# Patient Record
Sex: Female | Born: 1955 | Race: Black or African American | Hispanic: No | Smoking: Never smoker
Health system: Southern US, Community
[De-identification: ages and names within clinical notes are randomized; demographics above are authoritative.]

## PROBLEM LIST (undated history)

## (undated) DIAGNOSIS — D219 Benign neoplasm of connective and other soft tissue, unspecified: Secondary | ICD-10-CM

## (undated) DIAGNOSIS — R87629 Unspecified abnormal cytological findings in specimens from vagina: Secondary | ICD-10-CM

## (undated) HISTORY — PX: ABDOMINAL HYSTERECTOMY: SHX81

## (undated) HISTORY — DX: Unspecified abnormal cytological findings in specimens from vagina: R87.629

## (undated) HISTORY — PX: NOSE SURGERY: SHX723

## (undated) HISTORY — PX: NECK SURGERY: SHX720

## (undated) HISTORY — DX: Benign neoplasm of connective and other soft tissue, unspecified: D21.9

---

## 2001-02-23 ENCOUNTER — Other Ambulatory Visit: Admission: RE | Admit: 2001-02-23 | Discharge: 2001-02-23 | Payer: Self-pay | Admitting: Obstetrics and Gynecology

## 2001-07-30 ENCOUNTER — Ambulatory Visit (HOSPITAL_COMMUNITY): Admission: RE | Admit: 2001-07-30 | Discharge: 2001-07-30 | Payer: Self-pay | Admitting: Internal Medicine

## 2001-07-30 ENCOUNTER — Encounter: Payer: Self-pay | Admitting: Internal Medicine

## 2001-11-15 ENCOUNTER — Ambulatory Visit (HOSPITAL_COMMUNITY): Admission: RE | Admit: 2001-11-15 | Discharge: 2001-11-15 | Payer: Self-pay | Admitting: Internal Medicine

## 2001-11-15 ENCOUNTER — Encounter: Payer: Self-pay | Admitting: Internal Medicine

## 2002-01-10 ENCOUNTER — Ambulatory Visit (HOSPITAL_COMMUNITY): Admission: RE | Admit: 2002-01-10 | Discharge: 2002-01-11 | Payer: Self-pay | Admitting: Neurosurgery

## 2002-01-10 ENCOUNTER — Encounter: Payer: Self-pay | Admitting: Neurosurgery

## 2002-02-16 ENCOUNTER — Encounter: Admission: RE | Admit: 2002-02-16 | Discharge: 2002-02-16 | Payer: Self-pay | Admitting: Neurosurgery

## 2002-02-16 ENCOUNTER — Encounter: Payer: Self-pay | Admitting: Neurosurgery

## 2002-03-04 ENCOUNTER — Encounter: Admission: RE | Admit: 2002-03-04 | Discharge: 2002-03-04 | Payer: Self-pay | Admitting: Neurosurgery

## 2002-03-04 ENCOUNTER — Encounter: Payer: Self-pay | Admitting: Neurosurgery

## 2002-09-21 ENCOUNTER — Ambulatory Visit (HOSPITAL_COMMUNITY): Admission: RE | Admit: 2002-09-21 | Discharge: 2002-09-21 | Payer: Self-pay | Admitting: Obstetrics and Gynecology

## 2002-09-21 ENCOUNTER — Encounter: Payer: Self-pay | Admitting: Obstetrics and Gynecology

## 2003-03-20 ENCOUNTER — Encounter: Payer: Self-pay | Admitting: Obstetrics and Gynecology

## 2003-03-20 ENCOUNTER — Ambulatory Visit (HOSPITAL_COMMUNITY): Admission: RE | Admit: 2003-03-20 | Discharge: 2003-03-20 | Payer: Self-pay | Admitting: Obstetrics and Gynecology

## 2003-03-22 ENCOUNTER — Ambulatory Visit (HOSPITAL_COMMUNITY): Admission: RE | Admit: 2003-03-22 | Discharge: 2003-03-22 | Payer: Self-pay | Admitting: Obstetrics and Gynecology

## 2003-03-22 ENCOUNTER — Encounter: Payer: Self-pay | Admitting: Obstetrics and Gynecology

## 2003-05-26 ENCOUNTER — Ambulatory Visit (HOSPITAL_COMMUNITY): Admission: RE | Admit: 2003-05-26 | Discharge: 2003-05-26 | Payer: Self-pay | Admitting: Obstetrics & Gynecology

## 2003-05-26 ENCOUNTER — Encounter: Payer: Self-pay | Admitting: Obstetrics & Gynecology

## 2003-10-16 ENCOUNTER — Ambulatory Visit (HOSPITAL_COMMUNITY): Admission: RE | Admit: 2003-10-16 | Discharge: 2003-10-16 | Payer: Self-pay | Admitting: Internal Medicine

## 2004-06-04 ENCOUNTER — Ambulatory Visit (HOSPITAL_COMMUNITY): Admission: RE | Admit: 2004-06-04 | Discharge: 2004-06-04 | Payer: Self-pay | Admitting: Obstetrics & Gynecology

## 2004-06-28 ENCOUNTER — Ambulatory Visit (HOSPITAL_COMMUNITY): Admission: RE | Admit: 2004-06-28 | Discharge: 2004-06-28 | Payer: Self-pay | Admitting: Internal Medicine

## 2005-06-11 ENCOUNTER — Ambulatory Visit (HOSPITAL_COMMUNITY): Admission: RE | Admit: 2005-06-11 | Discharge: 2005-06-11 | Payer: Self-pay | Admitting: Internal Medicine

## 2005-11-08 ENCOUNTER — Emergency Department (HOSPITAL_COMMUNITY): Admission: EM | Admit: 2005-11-08 | Discharge: 2005-11-08 | Payer: Self-pay | Admitting: Emergency Medicine

## 2005-11-17 ENCOUNTER — Inpatient Hospital Stay (HOSPITAL_COMMUNITY): Admission: AD | Admit: 2005-11-17 | Discharge: 2005-11-21 | Payer: Self-pay | Admitting: Obstetrics and Gynecology

## 2005-11-19 ENCOUNTER — Encounter: Payer: Self-pay | Admitting: Obstetrics and Gynecology

## 2006-06-19 IMAGING — CT CT PELVIS W/ CM
1 of 3 series · 14 of 32 positions shown, 19 images · IV contrast (CONTRAST)
Comparison: None.

CLINICAL DATA: Right low back pain and pelvic pain.  
ABDOMEN CT WITH CONTRAST:
TECHNIQUE: Multidetector CT imaging of the abdomen was performed following the standard protocol during bolus administration of intravenous contrast.
Contrast:  cc Omnipaque 300.
TECHNIQUE: Multidetector CT imaging of the pelvis was performed following the standard protocol during bolus administration of intravenous contrast.

[Series 31: — · axial · 0.74mm/px · z∈[+1149,+1584]mm · 14 of 99 slices shown, 19 images]
[im 6/99  soft-tissue]
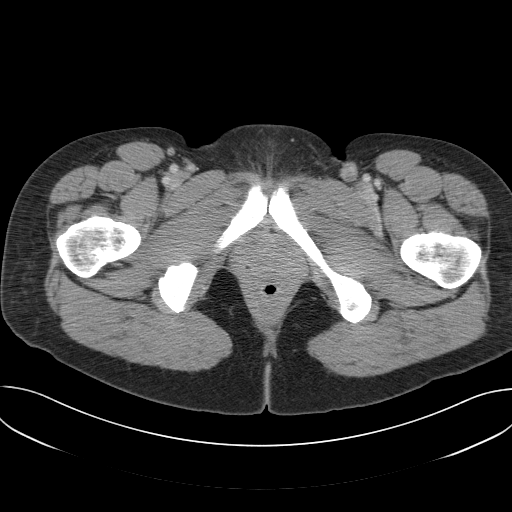
[im 6/99  bone]
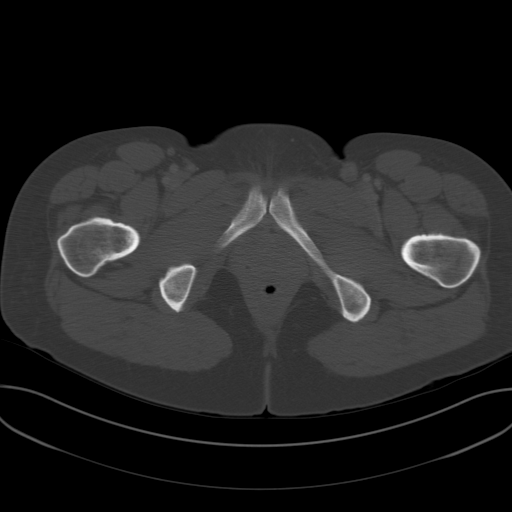
[im 11/99  soft-tissue]
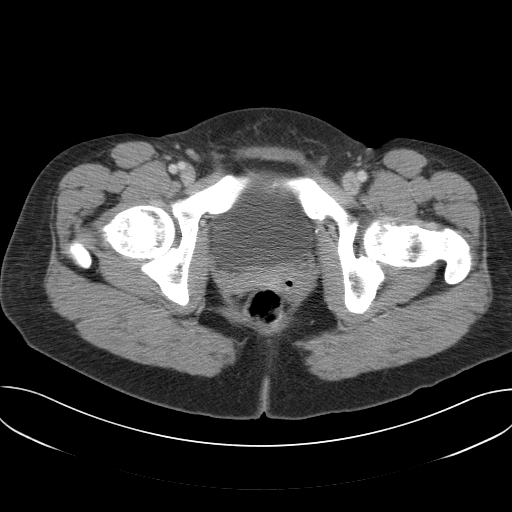
[im 22/99  soft-tissue]
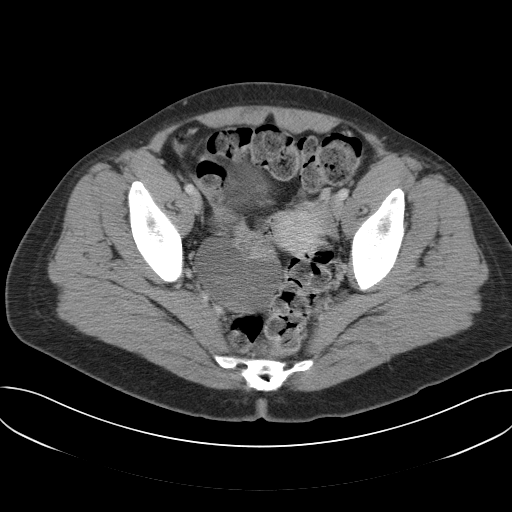
[im 28/99  soft-tissue]
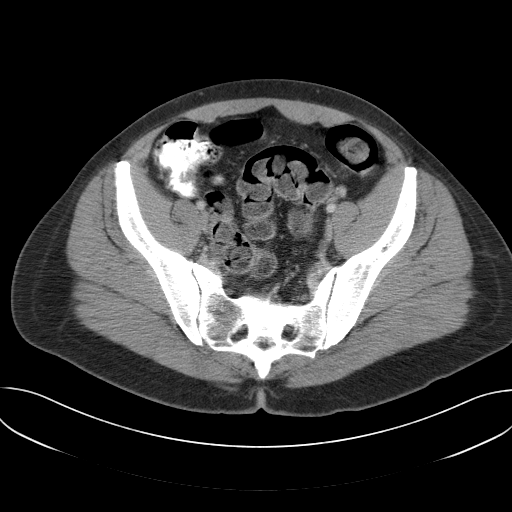
[im 33/99  soft-tissue]
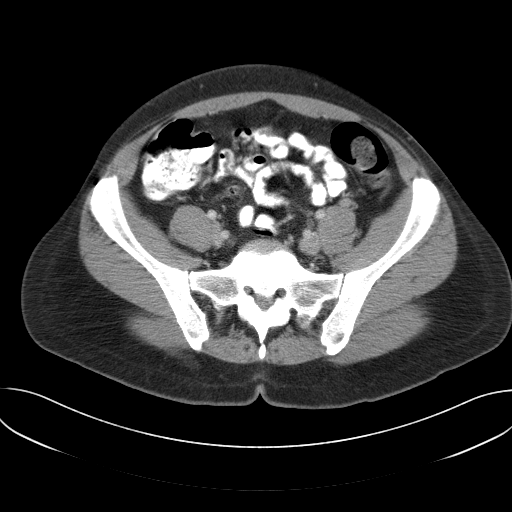
[im 44/99  soft-tissue]
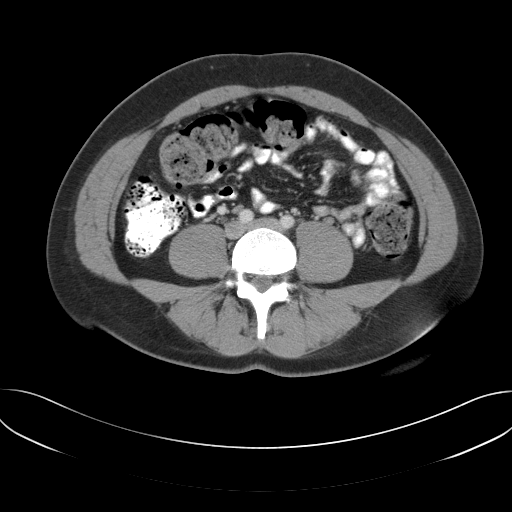
[im 50/99  soft-tissue]
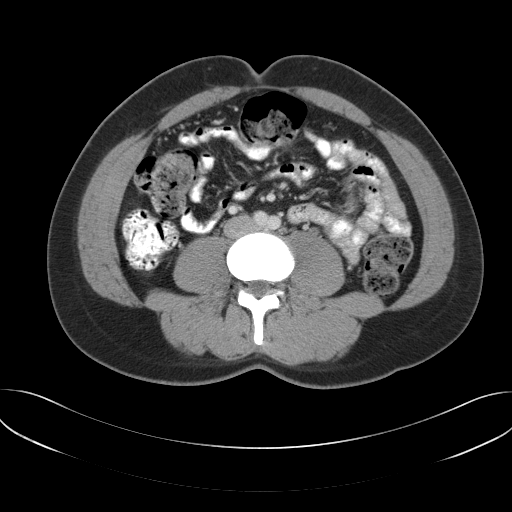
[im 55/99  soft-tissue]
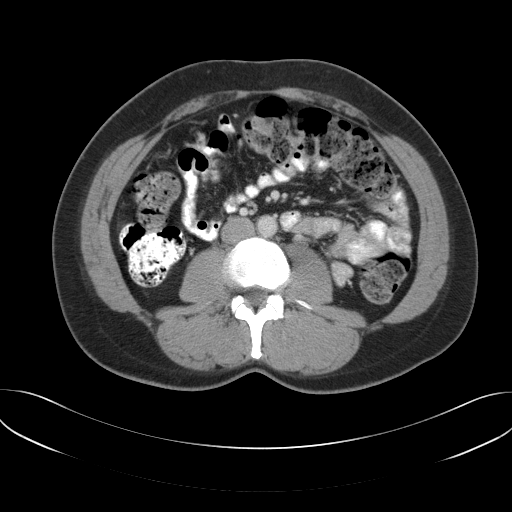
[im 66/99  soft-tissue]
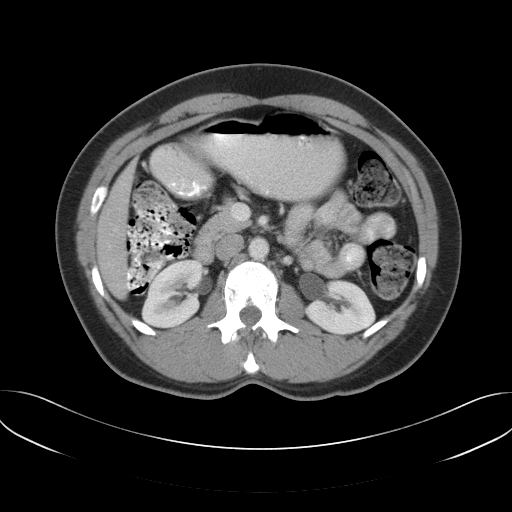
[im 66/99  bone]
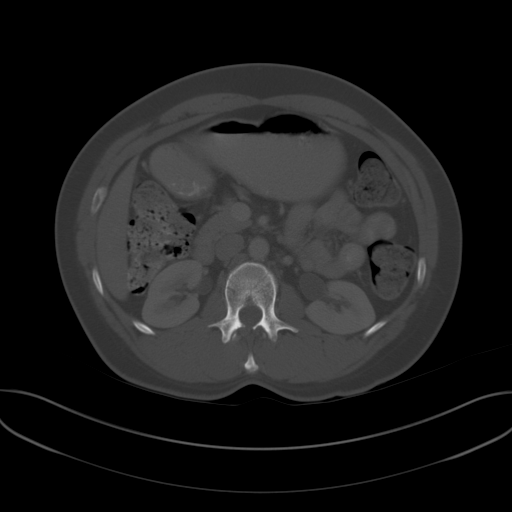
[im 71/99  soft-tissue]
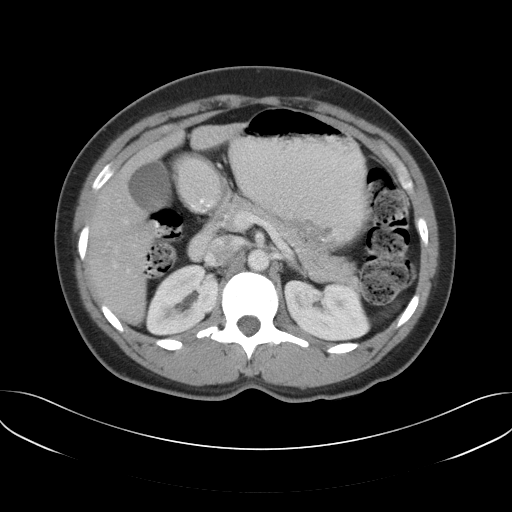
[im 77/99  soft-tissue]
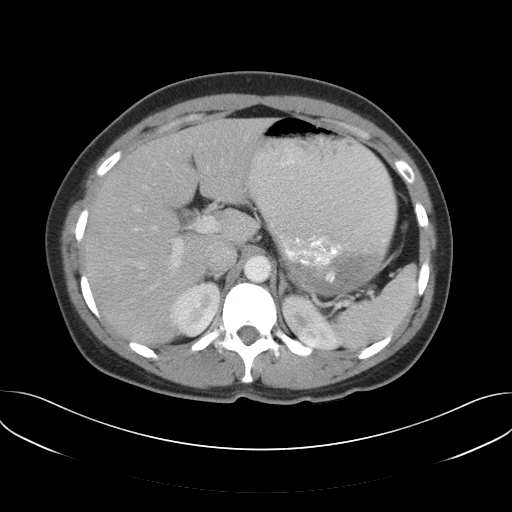
[im 77/99  lung]
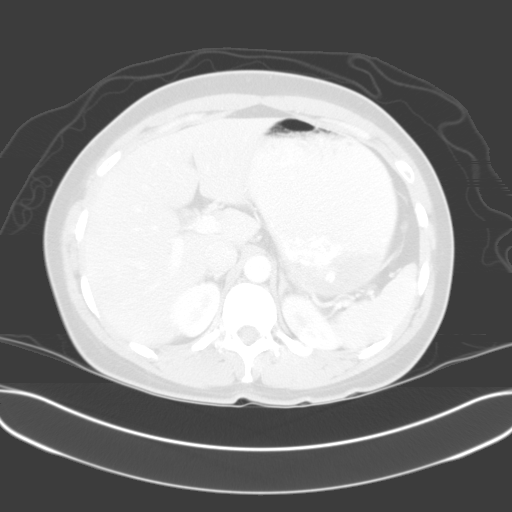
[im 82/99  lung]
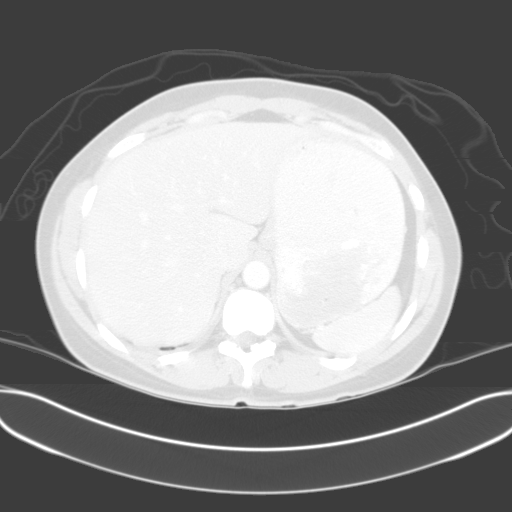
[im 88/99  soft-tissue]
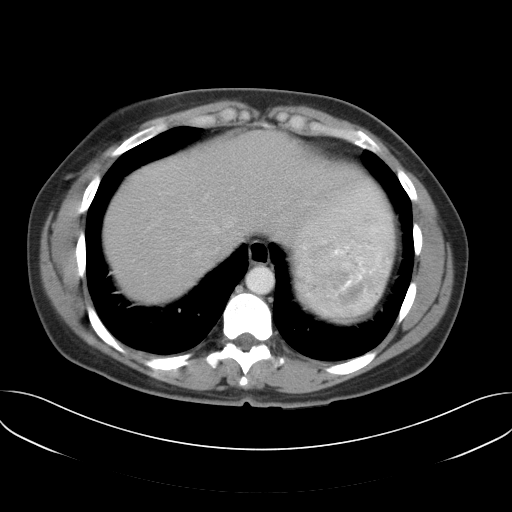
[im 88/99  lung]
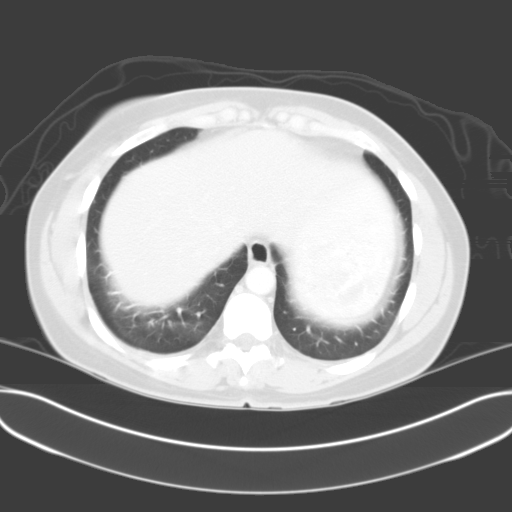
[im 93/99  soft-tissue]
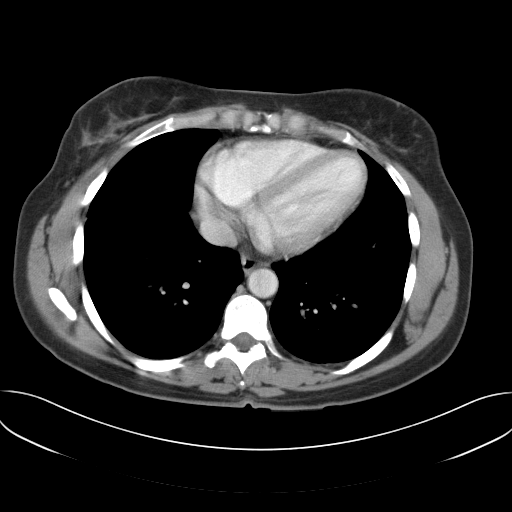
[im 93/99  lung]
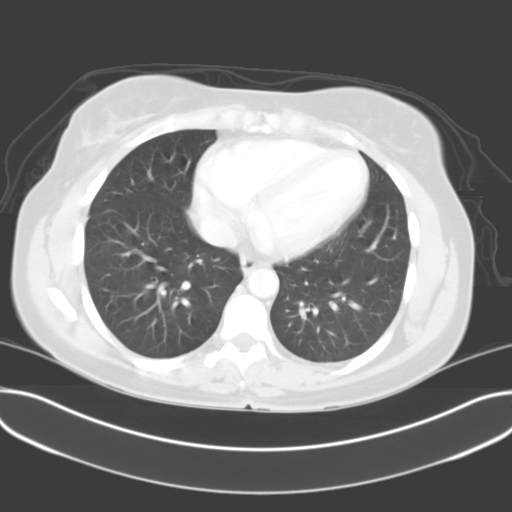

[14 of 32 positions shown; findings below may reference images not displayed]

FINDINGS: Visualized lung bases are clear.  No pleural or pericardial effusion.  
The liver is normal in attenuation and morphology.  Spleen is negative.  Adrenal glands are negative.  The pancreas is negative.  
Right kidney negative.  
Left kidney, prominent extrarenal pelvis but no evidence for nephrolithiasis or hydronephrosis.
The appendix is normal measuring 5.6 mm in diameter.  
The visualized abdominal bowel loops are normal.  
Review of the bone windows is unremarkable.
IMPRESSION: No acute abdomen CT findings.  
PELVIS CT WITH CONTRAST:
FINDINGS: Urinary bladder is normal.  The uterus is normal in appearance.
Left ovary is negative.  Within the right adnexa, there is a predominantly fluid attenuating mass containing some internal septations, measuring approximately 5.5 x 6.7 cm (image #78).  I suspect this is ovarian in origin.  This may represent either a benign ovarian cyst.  Ovarian cystic neoplasm is not excluded.  Follow-up imaging is recommended to ensure complete resolution.  There is no free pelvic fluid.  The visualized pelvic bowel loops are negative.  No inguinal or pelvic lymphadenopathy is noted.
IMPRESSION: Large predominantly cystic mass in the right adnexa, likely ovarian in origin.  Although this may represent a large ovarian cyst, cannot rule out neoplasm.  Follow-up examination is recommended to ensure complete resolution.

## 2006-06-29 ENCOUNTER — Ambulatory Visit (HOSPITAL_COMMUNITY): Admission: RE | Admit: 2006-06-29 | Discharge: 2006-06-29 | Payer: Self-pay | Admitting: Obstetrics and Gynecology

## 2007-07-19 ENCOUNTER — Ambulatory Visit (HOSPITAL_COMMUNITY): Admission: RE | Admit: 2007-07-19 | Discharge: 2007-07-19 | Payer: Self-pay | Admitting: Obstetrics and Gynecology

## 2007-09-22 ENCOUNTER — Other Ambulatory Visit: Admission: RE | Admit: 2007-09-22 | Discharge: 2007-09-22 | Payer: Self-pay | Admitting: Obstetrics and Gynecology

## 2008-10-03 ENCOUNTER — Ambulatory Visit (HOSPITAL_COMMUNITY): Admission: RE | Admit: 2008-10-03 | Discharge: 2008-10-03 | Payer: Self-pay | Admitting: Obstetrics and Gynecology

## 2008-10-03 ENCOUNTER — Other Ambulatory Visit: Admission: RE | Admit: 2008-10-03 | Discharge: 2008-10-03 | Payer: Self-pay | Admitting: Obstetrics and Gynecology

## 2010-02-25 ENCOUNTER — Ambulatory Visit: Payer: Self-pay | Admitting: Gastroenterology

## 2010-02-25 DIAGNOSIS — R1319 Other dysphagia: Secondary | ICD-10-CM

## 2010-02-25 DIAGNOSIS — R1012 Left upper quadrant pain: Secondary | ICD-10-CM | POA: Insufficient documentation

## 2010-02-25 DIAGNOSIS — R1013 Epigastric pain: Secondary | ICD-10-CM

## 2010-02-25 DIAGNOSIS — K5909 Other constipation: Secondary | ICD-10-CM | POA: Insufficient documentation

## 2010-02-26 ENCOUNTER — Telehealth (INDEPENDENT_AMBULATORY_CARE_PROVIDER_SITE_OTHER): Payer: Self-pay

## 2010-03-07 ENCOUNTER — Ambulatory Visit: Payer: Self-pay | Admitting: Gastroenterology

## 2010-03-07 ENCOUNTER — Ambulatory Visit (HOSPITAL_COMMUNITY): Admission: RE | Admit: 2010-03-07 | Discharge: 2010-03-07 | Payer: Self-pay | Admitting: Gastroenterology

## 2010-03-21 ENCOUNTER — Other Ambulatory Visit: Admission: RE | Admit: 2010-03-21 | Discharge: 2010-03-21 | Payer: Self-pay | Admitting: Obstetrics and Gynecology

## 2010-07-17 ENCOUNTER — Ambulatory Visit: Payer: Self-pay | Admitting: Gastroenterology

## 2010-12-10 NOTE — Letter (Signed)
Summary: EGD/TCS ORDER  EGD/TCS ORDER   Imported By: Ave Filter 02/25/2010 10:52:01  _____________________________________________________________________  External Attachment:    Type:   Image     Comment:   External Document  Appended Document: EGD/TCS ORDER Added take half Glucophage to pt's instructions and changed date of Dulcolax to 4/25 and 4/26.

## 2010-12-10 NOTE — Progress Notes (Signed)
Summary: phone note/pt called and said she takes asa daily  Phone Note Call from Patient   Caller: Patient Summary of Call: Pt called and said she forgot to let us know she takes an 81 mg ASA everyday.  She is scheduled for a procedure on 03/07/2010. Please advise! Initial call taken by: Cloria Spring LPN,  February 26, 2010 4:33 PM     Appended Document: phone note/pt called and said she takes asa daily May stay on ASA.  Appended Document: phone note/pt called and said she takes asa daily LMOM to call.  Appended Document: phone note/pt called and said she takes asa daily Returned pt's VM. LMOM OK to stay on ASA, and for her to return call and let me know she got the message.  Appended Document: phone note/pt called and said she takes asa daily Pt returned call and spoke to Darl Pikes and told her she got my message to stay on the ASA.

## 2010-12-10 NOTE — Assessment & Plan Note (Signed)
Summary: ABD PAIN, DYSPAHAGIA, CONSTIPATION   Visit Type:  Follow-up Visit Primary Care Provider:  Emelda Fear, M.D.  Chief Complaint:  3 month follow up- doing ok.  History of Present Illness: Having a BM more often with MLX. OMP helped abd pain. No nausea or vomiting. No questions about the procedure. No problems swallowing. Drinks water once daily and eats Cheerios, whole wheat bread. Not using ASA or Ibuprofen.  Current Medications (verified): 1)  Singulair 10 Mg Tabs (Montelukast Sodium) .... Once Daily 2)  Glucophage Xr 500 Mg Xr24h-Tab (Metformin Hcl) .... Once Daily 3)  Zyrtec Allergy 10 Mg Tabs (Cetirizine Hcl) .... Once Daily 4)  Nasonex 50 Mcg/act Susp (Mometasone Furoate) .... Once Daily 5)  Proventil Hfa 108 (90 Base) Mcg/act Aers (Albuterol Sulfate) .... Once Daily 6)  Advair Diskus 250-50 Mcg/dose Aepb (Fluticasone-Salmeterol) .... Once Daily 7)  Polyethylene Glycol 3350  Powd (Polyethylene Glycol 3350) .Marland KitchenMarland KitchenMarland Kitchen 17 Grams By Mouth Daily For Constipation 8)  Advil 200 Mg Tabs (Ibuprofen) .... Prn 9)  Asa 81 Mg .... Take 1 Tablet By Mouth Once A Day 10)  Omeprazole 20 Mg Cpdr (Omeprazole) .... Once Daily  Allergies (verified): 1)  ! Pcn  Past History:  Past Surgical History: Last updated: 02/25/2010 Right oophorectomy with hysterectomy. Right hemorrhagic cyst and uterine fibroids. Cervical disc surgery Deviated septum  Social History: Last updated: 07/17/2010 Single. Grown daughter. Equity: [employer]. Never smoked. No drug. No alcohol.   Past Medical History: Diabetes, dx 2004 Allergies Asthma APR 2011: TCS/EGD/DIL 16-NSAID GASTRITIS  Social History: Single. Grown daughter. Equity: [employer]. Never smoked. No drug. No alcohol.   Vital Signs:  Patient profile:   55 year old female Height:      67.5 inches Weight:      170 pounds BMI:     26.33 Temp:     98.2 degrees F oral Pulse rate:   72 / minute BP sitting:    128 / 88  (left arm) Cuff size:   regular  Vitals Entered By: Hendricks Limes LPN (July 17, 2010 8:54 AM)  Physical Exam  General:  Well developed, well nourished, no acute distress. Head:  Normocephalic and atraumatic. Lungs:  Clear throughout to auscultation. Heart:  Regular rate and rhythm; no murmurs. Abdomen:  Soft, nontender and nondistended.  Normal bowel sounds.  Impression & Recommendations:  Problem # 1:  LUQ PAIN (ICD-789.02) Assessment Improved Continue PPI. May take ASA 81 mg daily. If pain returns only take if medically necessary. Avoid ibuprofenor naproxen if they cause abd pain.  Problem # 2:  OTHER DYSPHAGIA (ICD-787.29) Assessment: Improved No indication for EGD. OPV as needed.  CC:PCP  Problem # 3:  CONSTIPATION, CHRONIC (ICD-564.09) Assessment: Improved Continue water, fiber, and MLX. TCS 2021.  Appended Document: Orders Update    Clinical Lists Changes  Orders: Added new Service order of Est. Patient Level I (16109) - Signed

## 2010-12-10 NOTE — Assessment & Plan Note (Signed)
Summary: ABD. PAIN/CONSULT FOR TCS/SS   Visit Type:  Initial Consult Primary Care Provider:  Dr. Felecia Shelling  Chief Complaint:  abd pain and needs tcs.  History of Present Illness: [name] is a pleasant 55 year old lady who presents today for abdominal pain and chronic constipation. Last year she was referred for colonoscopy but she postponed it until now. She called earlier this month to schedule however because of symptoms she was asked to be seen in the office. She complains of chronic constipation all her life. She may have one bowel movement per week. She does take anything for her bowels. She denies any blood in the stool or melena. Denies any weight loss. About 10 days ago she began having epigastric pain which she described as crampy in nature. She also had some loose stools for a couple of days.  The pain continues off/on since that time. She also has chronic pain in the left upper abdomen. She's not sure if the pain is aggravated by meals. She takes Advil for pain intermittently. She also complains of intermittent dysphagia to solid foods. She had prior cervical neck surgery. She feels like her throat is closing at times.    Current Medications (verified): 1)  Singulair 10 Mg Tabs (Montelukast Sodium) .... Once Daily 2)  Glucophage Xr 500 Mg Xr24h-Tab (Metformin Hcl) .... Once Daily 3)  Zyrtec Allergy 10 Mg Tabs (Cetirizine Hcl) .... Once Daily 4)  Nasonex 50 Mcg/act Susp (Mometasone Furoate) .... Once Daily 5)  Proventil Hfa 108 (90 Base) Mcg/act Aers (Albuterol Sulfate) .... Once Daily 6)  Advair Diskus 250-50 Mcg/dose Aepb (Fluticasone-Salmeterol) .... Once Daily  Allergies (verified): 1)  ! Pcn  Past History:  Past Medical History: Diabetes, dx 2004 Allergies Asthma  Past Surgical History: Right oophorectomy with hysterectomy. Right hemorrhagic cyst and uterine fibroids. Cervical disc surgery Deviated septum  Family History: No FH of CRC, liver disease,  chronic GI illness Mother, DM 4 brothers, 2 with heart murmur  Social History: Single. Grown daughter. Equity. Never smoked. No drug. No alcohol.   Review of Systems General:  Denies fever, chills, sweats, anorexia, fatigue, weakness, and weight loss. Eyes:  Denies vision loss. ENT:  Complains of difficulty swallowing; denies nasal congestion, sore throat, and hoarseness. CV:  Denies chest pains, angina, palpitations, dyspnea on exertion, and peripheral edema. Resp:  Denies dyspnea at rest, dyspnea with exercise, and cough. GI:  See HPI. GU:  Denies urinary burning and blood in urine. MS:  Denies joint pain / LOM. Derm:  Denies rash and itching. Neuro:  Denies weakness, frequent headaches, memory loss, and confusion. Psych:  Denies depression and anxiety. Endo:  Denies unusual weight change. Heme:  Denies bruising and bleeding. Allergy:  Denies hives and rash.  Vital Signs:  Patient profile:   55 year old female Height:      67.5 inches Weight:      165 pounds BMI:     25.55 Temp:     98.0 degrees F oral Pulse rate:   84 / minute BP sitting:   138 / 98  (left arm) Cuff size:   regular  Vitals Entered By: Hendricks Limes LPN (February 25, 2010 9:48 AM)  Physical Exam  General:  Well developed, well nourished, no acute distress. Head:  Normocephalic and atraumatic. Eyes:  Conjunctivae pink, no scleral icterus.  Mouth:  Oropharyngeal mucosa moist, pink.  No lesions, erythema or exudate.    Neck:  Supple; no masses or thyromegaly. Lungs:  Clear throughout  to auscultation. Heart:  Regular rate and rhythm; no murmurs, rubs,  or bruits. Abdomen:  Soft. Positive BS. Mild epigastric tenderness to deep palpation. No HSM or masses. No abd bruit or hernia. No rebound or guarding. Rectal:  deferred until time of colonoscopy.   Extremities:  No clubbing, cyanosis, edema or deformities noted. Neurologic:  Alert and  oriented x4;  grossly normal neurologically. Skin:  Intact without  significant lesions or rashes. Cervical Nodes:  No significant cervical adenopathy. Psych:  Alert and cooperative. Normal mood and affect.  Impression & Recommendations:  Problem # 1:  CONSTIPATION, CHRONIC (ICD-564.09)  Add miralax 17 grams by mouth daily. Drink plenty of fluids and increase dietary fiber. Daily excercise. Colonoscopy to be performed in near future.  Risks, alternatives, and benefits including but not limited to the risk of reaction to medication, bleeding, infection, and perforation were addressed.  Patient voiced understanding and provided verbal consent. Modified prep secondary to constipation.  Orders: New Patient Level III (16109)  Problem # 2:  EPIGASTRIC PAIN (ICD-789.06)  Chronic left upper abd tenderness but recent epigastric tenderness. Etiology unclear. She's not sure if aggravated by meals. Could be related to constipation, gastritis, pud, biliary. EGD to be performed in near future.  Risks, alternatives, benefits including but not limited to risk of reaction to medications, bleeding, infection, and perforation addressed.  Patient voiced understanding and verbal consent obtained.   Orders: New Patient Level III (60454)  Problem # 3:  OTHER DYSPHAGIA (ICD-787.29)  ?secondary to prior neck surgery. Symptoms intermittent. Cannot rule out esophageal abnormality. EGD to be performed in near future.  Risks, alternatives, benefits including but not limited to risk of reaction to medications, bleeding, infection, and perforation addressed.  Patient voiced understanding and verbal consent obtained.   Orders: New Patient Level III (09811)  Patient Instructions: 1)  Begin miralax 17 gram by mouth daily for constipation. 2)  The medication list was reviewed and reconciled.  All changed / newly prescribed medications were explained.  A complete medication list was provided to the patient / caregiver. Prescriptions: POLYETHYLENE GLYCOL 3350  POWD (POLYETHYLENE GLYCOL  3350) 17 grams by mouth daily for constipation  #527g x 5   Entered and Authorized by:   Leanna Battles. Dixon Boos   Signed by:   Leanna Battles Jurney Overacker PA-C on 02/25/2010   Method used:   Print then Give to Patient   RxID:   [id number]   Appended Document: ABD. PAIN/CONSULT FOR TCS/SS NEEDS PEDS SCOPE.

## 2011-01-28 LAB — GLUCOSE, CAPILLARY: Glucose-Capillary: 101 mg/dL — ABNORMAL HIGH (ref 70–99)

## 2011-03-28 NOTE — Op Note (Signed)
[place]  Patient:    Christie Morgan, Christie Morgan Visit Number: [id number] MRN: [id number]          Service Type: DSU Location: 3000 [id number] Attending Physician:  Donn Pierini Dictated by:   Julio Sicks, M.D. Proc. 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  OPERATION PERFORMED:  C4-5 and C5-6 anterior cervical diskectomy and fusion with allograft and anterior plating.  SURGEON:  Julio Sicks, M.D.  ASSISTANT:  Bradd Canary., M.D.  ANESTHESIA:  General endotracheal.  INDICATIONS FOR PROCEDURE:  The patient is a 55 year old female with a history of neck and bilateral upper extremity symptoms, left much greater than right. Her symptoms predominantly consist of a left-sided C5 radiculopathy.  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  MRI scanning demonstrated a large leftward C4-5 disk herniation with compression of the left-sided C5 nerve root.  Also has a bilobed disk herniation at C5-6 with significant spinal stenosis.  There is an asymptomatic right-sided C6-7 disk herniation which is not addressed at this time.  The patient has been counseled as to her options.  She has decided to proceed with the C4-5 and C5-6 anterior cervical diskectomy and fusion with allograft and anterior plating for hopeful improvement in her symptoms.   DESCRIPTION OF PROCEDURE:  The patient was taken to the operating room and placed on the operating table in supine position.  After an adequate level of anesthesia was achieved, the patient was positioned supine with the neck slightly extended and held in place with halter traction.  The  patients anterior cervical region was shaved and prepped sterilely.  A 10 blade was used to make a linear incision extending from just right of midline to the medial border of the sternocleidomastoid on the right.  This was carried down sharply to the platysma.  The platysma was then elevated and divided vertically and dissection proceeded along the medial border of the sternocleidomastoid muscle and carotid sheath on the right side.  The trachea and esophagus were mobilized and retracted towards the left.  Prevertebral fascia was stripped off the anterior spinal column.  The longus colli muscles were then elevated bilaterally using electrocautery.  Deep self-retaining retractor was placed.  Intraoperative fluoroscopy was used and the C4-5 and C5-6 levels were confirmed.  The disk space was then incised at both levels with a 15 blade in a rectangular fashion.  A wide disk space clean-out was then achieved using pituitary rongeurs, forward and backward angled Carlens curets, Kerrison rongeurs and a high speed drill to remove all the disk down to the level of the posterior annulus.  Microscope was brought into the field and used throughout the remainder of the diskectomy.  Starting first at C4-5 the remaining aspects of annulus and osteophytes were removed down to the level of the posterior longitudinal ligament.  The posterior longitudinal ligament was then elevated and resected in piecemeal fashion using Kerrison rongeurs.  The underlying thecal sac was identified.  Decompression was then performed in the central spinal canal by undercutting the bodies of C4 and C5. Decompression then proceeded out to the left  sided C5 foramen.  All elements of disk herniation were encountered and completely resected.  The C5 nerve root was identified proximally and followed out distally to its foramen.  A wide anterior foraminotomy was performed.  Decompression then proceeded out to the right sided C5  foramen.  The C5 nerve root was identified proximally and decompressed anteriorly.  Attention was then placed at the C5-6 level.  Once again, the remaining aspects of osteophyte and annulus were removed down to the level of the posterior longitudinal ligament.  The posterior longitudinal ligament was then elevated and resected in piecemeal fashion using Kerrison rongeurs.  The underlying thecal sac was identified.  A wide central decompression was then performed by using Kerrison rongeurs to undercut the bodies of C5 and C6.  Decompression then proceeded out to the left-sided C6 foramen.  The C6 nerve root was identified and widely decompression throughout its foramen.  All elements of the disk herniation were completely resected. Decompression then proceeded out to the right-sided C6 foramen.  Once again, a soft disk herniation was encountered and completely resected.  Decompression then proceeded out to the right-sided C6 foramen.  The C6 nerve root was identified proximally and widely decompressed anteriorly.  The wound was then copiously irrigated with antibiotic solution.  Gelfoam was placed in each wound for hemostasis which was found to be good.  A 7 mm patellar wedge allograft was impacted into place and recessed approximately 1 mm from the anterior cortical surface at both levels.  A 40 mm Atlantis anterior cervical plate was then placed over the C4, C5 and C6 levels.  It was then attached under fluoroscopic guidance by using 14 mm fixed angle screws, two each at C4, C5 and C6.  All six screws were given a final tightening and found to be solidly within bone.  Locking screws were engaged at both levels.  Retraction system was then removed.  Final images revealed good position of the bone grafts and hardware at the proper operative level with normal alignment of the spine.  Wound was inspected for hemostasis which was found to be good.  It was then closed in a routine fashion.   Steri-Strips and sterile dressing were applied.  There was no apparent complication.  The patient returned to the recovery room postoperatively. Dictated by:   Julio Sicks, M.D.  Attending Physician:  Donn Pierini DD:  01/10/02 TD:  01/10/02 Job: [id number]

## 2011-03-28 NOTE — Op Note (Signed)
NAME:  Christie Morgan, Christie Morgan              ACCOUNT NO.:  [id number]   MEDICAL RECORD NO.:  [id number]          PATIENT TYPE:  INP   LOCATION:  A427                          FACILITY:  [facility]   PHYSICIAN:  Tilda Burrow, M.D. DATE OF BIRTH:  11-Mar-1956   DATE OF PROCEDURE:  DATE OF DISCHARGE:                                 OPERATIVE REPORT   PREOPERATIVE DIAGNOSES:  1.  Right ovarian mass.  2.  Uterine fibroids.   POSTOPERATIVE DIAGNOSES:  1.  Right ovarian hemorrhagic cyst.  2.  Uterine fibroids.   PROCEDURE:  1.  Supracervical hysterectomy.  2.  Right salpingo-oophorectomy.   SURGEON:  Tilda Burrow, M.D.   ASSISTANTAmie Critchley.   ANESTHESIA:  General.   SPECIMENS:  Uterus and right adnexa sent to lab.   ESTIMATED BLOOD LOSS:  150 mL.   COMPLICATIONS:  Distinctly redundant sigmoid colon with generous hard stool  despite magnesium citrate taken preoperative.  Patient suspected to have an  abnormal bowel function.   DETAILS OF PROCEDURE:  The patient was taken to the operating room and  prepped and draped for lower abdominal surgery.  An old Pfannenstiel-type  incision was performed, opening the abdominal cavity without difficulty.  The bowel was inspected, packed away, and pelvis addressed.  The right ovary  was found to have large cyst which were felt to warrant removal.  We  proceeded with taking down the round ligaments with suture ligature of #0  chromic, transection and then ligature with #0 chromic. On the right side  the infundibulopelvic ligament was isolated; and accessed while being  careful to ensure that the ureter was well out of the surgical field, cross  clamping the infundibulopelvic ligament with Kelly clamp, transecting it and  suture ligating with #0 chromic.   On the left side the tube and ovary were considered salvageable and so the  uteroovarian ligament was identified, isolated, clamped, cut, and suture  ligated along with the left tube and  round ligament pedicle.   The uterine vessels were then skeletonized, cross-clamped with curved Heaney  clamps, followed by Tresa Endo clamp placed above for back bleeding, and then the  uterine vessels transected and suture ligated with #0 chromic. The upper and  lower cardinal ligaments were serially clamped, cut, and suture ligated  using straight Heaney clamp, knife dissection and suture ligature with #0  chromic.  Upon reaching the level of the lower cardinal ligaments, while  resting just above the uterosacral ligaments into the posterior aspects of  the cervix, we proceeded with amputation of the lower uterine segment off of  the cervix. This was performed with a slight coring effect, leaving a small  portion of the endocervical tissue intact.  The cervix, itself, was then  closed with running #0 chromic and inspected for hemostasis. The irrigation  confirmed that the hemostasis was adequate, and the bladder flap used to  reperitonealize the pelvic floor over the cervical stump.  Irrigation,  again, confirmed hemostasis.   We then reapproximated the tissues with interrupted 2-0 chromic suture.  Pedicles were inspected and found hemostatic otherwise. The  patient then  went to recovery room in good condition.   ADDENDUM:  It should be noted that the patient's redundant sigmoid had  generous amounts of hard, formed stool despite magnesium citrate, given  during the preoperative time the night before. This was specifically  confirmed with the patient the next day.  She may be a candidate for  consideration of anti-IBS medications in the future, such as Zelnorm.      Tilda Burrow, M.D.  Electronically Signed     JVF/MEDQ  D:  12/10/2005  T:  12/11/2005  Job:  [id number]

## 2011-03-28 NOTE — Discharge Summary (Signed)
NAME:  Christie Morgan, Christie Morgan             ACCOUNT NO.:  [id number]   MEDICAL RECORD NO.:  [id number]          PATIENT TYPE:  INP   LOCATION:  A427                          FACILITY:  [facility]   PHYSICIAN:  Tilda Burrow, M.D. DATE OF BIRTH:  06/10/1956   DATE OF ADMISSION:  11/17/2005  DATE OF DISCHARGE:  01/12/2007LH                                 DISCHARGE SUMMARY   ADMISSION DIAGNOSES:  Severe pelvic and right lower flank pain.   DISCHARGE DIAGNOSES:  1.  Severe pelvic and right lower flank pain, improved.  2.  Chronic constipation.   PROCEDURES:  Supracervical hysterectomy performed on November 19, 2005.   HISTORY OF PRESENT ILLNESS:  This 55 year old female was admitted for pain  management evaluation of pelvic discomfort.  After being seen in our office  after evaluation as an outpatient in follow up in the emergency room earlier  this weekend when she was tearfully uncomfortable when seen in the emergency  room.  The pain went around the lower abdomen on the right lower quadrant  without nausea or vomiting.  On the exam, she showed soft fullness behind  the uterus on the right side making the pain dramatically worse with a soft  fluctuance, raising questions of evaluation.  Review of some of this was  notable for October 18 evaluation with a benign tumor on exam by Dr. Despina Hidden  with Pap smear class I September 2006.   PHYSICAL EXAMINATION:  GENERAL APPEARANCE:  Notable for a tearfully painful,  generally tall, African American female.  VITAL SIGNS:  Weight 173.  GYN:  Notable for normal cervix and nonpurulent with normal menses.  Present  uterus is anterior, normal size, shape and contour.   STUDIES:  CT scan was obtained which showed right adnexal fullness due to a  cyst.  Options were discussed with the patient at length on November 18, 2005,  and we presented with supracervical hysterectomy the following day.   LABORATORY DATA:  The patient's admitting hemoglobin was 11.4,  hematocrit  33.5, white count 8300.  The pathology specimen on the supracervical  hysterectomy revealed a relatively normal sized uterus with one small  intramural leiomyoma identified.  There were multiple small leiomyomas  throughout the uterus on pathology report.  There were benign simple cysts  of the right ovary and fallopian tube.  Left tube and ovary were left in  place.   Postoperative course was straightforward and the patient considered stable  for discharge in routine fashion on postoperative day #2 with incision  clean, abdomen soft, dressing dry, nontender with postoperative hemoglobin  11.2, hematocrit 33.2.   ADDENDUM:  It is noted the patient had chronically hard formed stool in the  sigmoid colon, even though she had had magnesium citrate one bottle the  night before surgery and after to evacuate the bowel.      Tilda Burrow, M.D.  Electronically Signed     JVF/MEDQ  D:  12/10/2005  T:  12/11/2005  Job:  [id number]   cc:   [facility]

## 2011-03-28 NOTE — H&P (Signed)
NAME:  Christie Morgan, Christie Morgan             ACCOUNT NO.:  [id number]   MEDICAL RECORD NO.:  [id number]          PATIENT TYPE:  INP   LOCATION:  A427                          FACILITY:  [facility]   PHYSICIAN:  Tilda Burrow, M.D. DATE OF BIRTH:  1956-08-20   DATE OF ADMISSION:  11/17/2005  DATE OF DISCHARGE:  LH                                HISTORY & PHYSICAL   ADMITTING DIAGNOSIS:  Severe pelvic and left lower flank pain.   HPI:  This 55 year old female with LMP 11/15/2005 is admitted for pain  management and evaluation of pelvic discomfort after being seen in our  office in followup of emergency room evaluation earlier this weekend, when  she was seen at [facility] where she was suspected to have a  bladder infection.  She reports that she had abdominal exam and laboratory  work and urinalysis but a pelvic exam was not performed.  At this time she  is tearfully uncomfortable due to pain just above the posterior superior  iliac crest without any radiation into the leg.  There is no CVA tenderness.  The pain rotates around, goes into the lower abdomen on the right lower  quadrant.  She has had no nausea or vomiting, she denies fever at this time.   On exam, she has a soft fullness behind the uterus on the right side which  makes the pain dramatically worse.  It has a soft fluctuance, raising  questions of infectious processes such as diverticular abscess as well as  adnexal torsion, etc.  GYN history has been relatively uneventful and the  patient treated in Fall of 2006 for ovarian cyst.  She was seen August 27, 2005, at which time she had essentially a benign GYN exam by Dr. Despina Hidden.  Most  recent Pap smear of September, 2006, was Class I.   PAST MEDICAL HISTORY:  1.  Positive for asthma.  2.  Nasal surgery.  3.  History of abnormal Pap smear.  4.  Elevated blood sugar.   SURGICAL HISTORY:  1.  Nasal surgery.  2.  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  3.   Cryocautery in 1997, 1998 and 1999, apparently.   ALLERGIES:  TO PENICILLIN WHICH CAUSES A RASH.   GENERAL EXAM:  She is a tall African-American female, weight 173, blood  pressure 170/86.  Urine shows 2+ blood, 1+ protein, but patient is on active  menses at the present time.  LMP began 11/15/2005.  GENERAL EXAM:  She is tearfully painful.  CHEST:  Clear.  ABDOMEN:  Bowel sounds present and active.  There is no anterior rebound  tenderness, though she is guarding her entire abdomen to some degree.  Suprapubic area is firm, felt to be guarding.  EXTERNAL GENITALIA:  Normal cervix, nonpurulent menses.  Uterus anterior,  normal size, shape and contour, normal position, it is mobile and contact  does not cause the pain in the left side.  Deep palpation on the right side  just behind the uterus identifies a tender fluctuance which is extremely  tender and causes the pain to increase.  A straight leg raise is negative,  __________ rotation is negative.  EXTREMITIES:  Are grossly normal.   IMPRESSION:  Severe right lower quadrant and back pain secondary to fullness  in the right adnexa, need to rule out kidney stone as well or diverticular  disease, though considered less likely.   PLAN:  CT abdomen, IV Dilaudid analgesics.  Will admit this p.m.      Tilda Burrow, M.D.  Electronically Signed     JVF/MEDQ  D:  11/17/2005  T:  11/17/2005  Job:  [id number]

## 2011-05-07 ENCOUNTER — Other Ambulatory Visit: Payer: Self-pay | Admitting: Obstetrics & Gynecology

## 2011-05-07 ENCOUNTER — Other Ambulatory Visit (HOSPITAL_COMMUNITY)
Admission: RE | Admit: 2011-05-07 | Discharge: 2011-05-07 | Disposition: A | Discharge status: Home / Self Care | Payer: 59 | Source: Ambulatory Visit | Attending: Obstetrics & Gynecology | Admitting: Obstetrics & Gynecology

## 2011-05-07 DIAGNOSIS — Z01419 Encounter for gynecological examination (general) (routine) without abnormal findings: Secondary | ICD-10-CM | POA: Insufficient documentation

## 2011-07-06 ENCOUNTER — Emergency Department (HOSPITAL_COMMUNITY)
Admission: EM | Admit: 2011-07-06 | Discharge: 2011-07-06 | Disposition: A | Discharge status: Home / Self Care | Payer: 59 | Attending: Emergency Medicine | Admitting: Emergency Medicine

## 2011-07-06 DIAGNOSIS — J039 Acute tonsillitis, unspecified: Secondary | ICD-10-CM | POA: Insufficient documentation

## 2011-07-06 DIAGNOSIS — E876 Hypokalemia: Secondary | ICD-10-CM | POA: Insufficient documentation

## 2011-07-06 DIAGNOSIS — R000 Tachycardia, unspecified: Secondary | ICD-10-CM | POA: Insufficient documentation

## 2011-07-06 DIAGNOSIS — E119 Type 2 diabetes mellitus without complications: Secondary | ICD-10-CM | POA: Insufficient documentation

## 2011-07-06 DIAGNOSIS — J45909 Unspecified asthma, uncomplicated: Secondary | ICD-10-CM | POA: Insufficient documentation

## 2011-07-06 DIAGNOSIS — E860 Dehydration: Secondary | ICD-10-CM | POA: Insufficient documentation

## 2011-07-06 LAB — BASIC METABOLIC PANEL
Chloride: 97 mEq/L (ref 96–112)
Potassium: 3.2 mEq/L — ABNORMAL LOW (ref 3.5–5.1)
Sodium: 136 mEq/L (ref 135–145)

## 2011-07-06 MED ORDER — MORPHINE SULFATE 4 MG/ML IJ SOLN
4.0000 mg | Freq: Once | INTRAMUSCULAR | Status: AC
  Administered 2011-07-06: 4 mg via INTRAVENOUS
  Filled 2011-07-06: qty 1

## 2011-07-06 MED ORDER — SODIUM CHLORIDE 0.9 % IV BOLUS (SEPSIS)
1500.0000 mL | Freq: Once | INTRAVENOUS | Status: AC
  Administered 2011-07-06: 1500 mL via INTRAVENOUS

## 2011-07-06 MED ORDER — SODIUM CHLORIDE 0.9 % IV SOLN: INTRAVENOUS | Status: DC

## 2011-07-06 MED ORDER — DEXAMETHASONE SODIUM PHOSPHATE 4 MG/ML IJ SOLN
10.0000 mg | Freq: Once | INTRAMUSCULAR | Status: AC
  Administered 2011-07-06: 10 mg via INTRAVENOUS
  Filled 2011-07-06: qty 3

## 2011-07-06 MED ORDER — HYDROCODONE-ACETAMINOPHEN 7.5-500 MG/15ML PO SOLN: 15.0000 mL | Freq: Four times a day (QID) | ORAL | Status: AC | PRN

## 2011-07-06 MED ORDER — CLINDAMYCIN HCL 150 MG PO CAPS: ORAL_CAPSULE | ORAL | Status: DC

## 2011-07-06 MED ORDER — CLINDAMYCIN PHOSPHATE 300 MG/50ML IV SOLN
900.0000 mg | Freq: Once | INTRAVENOUS | Status: AC
  Administered 2011-07-06: 900 mg via INTRAVENOUS
  Filled 2011-07-06: qty 150
  Filled 2011-07-06: qty 50
  Filled 2011-07-06: qty 100

## 2011-07-06 MED ORDER — POTASSIUM CHLORIDE 10 MEQ/100ML IV SOLN
10.0000 meq | Freq: Once | INTRAVENOUS | Status: AC
  Administered 2011-07-06: 10 meq via INTRAVENOUS
  Filled 2011-07-06: qty 100

## 2011-07-06 MED ORDER — ONDANSETRON HCL 4 MG/2ML IJ SOLN
4.0000 mg | INTRAMUSCULAR | Status: DC | PRN
  Administered 2011-07-06: 4 mg via INTRAVENOUS
  Filled 2011-07-06: qty 2

## 2011-07-06 NOTE — ED Provider Notes (Cosign Needed)
Scribed for Ward Givens, MD, the patient was seen in room APA04/APA04. This chart was scribed by [employer]. The patient's care started at 09:30  CSN: [id number] Arrival date & time: 07/06/2011  9:29 AM  Chief Complaint  Patient presents with  . Fever  . Cough  . Diarrhea   HPI Christie Morgan is a 55 y.o. female with a history of Asthma and diabetes mellitus who presents to the Emergency Department complaining of sore throat, onset Thursday, 07/03/2011. Associated symptoms include myalgia, fever, coughing up sputum, diarrhea, chills, vomiting, loss of appetite and mild lightheadedness when walking. She reports having 2 episodes loose stools on Friday morning. She denies abdominal pain, chest pain, or congestion. Patient states she was referred to the [facility] ER from urgent care, but was not seen by a doctor after hours of waiting. Patient works at [employer] and is not around the [employer]. She states she has an albuterol inhaler but does not use it much. Per family member, Pt's son has a history of fluids in his ears and a sinus infection. PCP is Dr Felecia Shelling. There are no other associated symptoms and no other alleviating or aggravating factors.   Pt had one episode of vomiting on Friday. Has loss of appetite, and not drinking much fluids.  Feels weak, lightheaded when standing. Went to [facility] ED last night and had fever in triage of 101, but was not seen by providers. Pt describes chills, myalgias. Pt lives with sister who's 2 yo has had recent sinus infection.   HPI ELEMENTS:    Onset: 07/03/2011 Duration: 3 days  Timing: constant Context:  as above  Associated symptoms:as above    Past Medical History  Diagnosis Date  . Asthma   . Diabetes mellitus    MEDICATIONS:  Previous Medications   No medications on file     ALLERGIES:  Allergies as of 07/06/2011 - Review Complete 07/06/2011  Allergen Reaction Noted  . Penicillins        Past Surgical History    Procedure Date  . Abdominal hysterectomy   Pt has had cervical disk surgery   No family history on file.  History  Substance Use Topics  . Smoking status: Never Smoker   . Smokeless tobacco: Not on file  . Alcohol Use: No    OB History    Grav Para Term Preterm Abortions TAB SAB Ect Mult Living                  Review of Systems  Constitutional: Positive for fever, chills, appetite change and fatigue.  HENT: Positive for sore throat, trouble swallowing and tinnitus. Negative for hearing loss, ear pain, congestion, rhinorrhea, sneezing, drooling and voice change.   Eyes: Negative.   Respiratory: Negative.   Cardiovascular: Negative.  Negative for palpitations.  Gastrointestinal: Negative.   Genitourinary: Negative.   Musculoskeletal: Positive for back pain.  Skin: Negative.   Neurological: Positive for dizziness, weakness and light-headedness.  All other systems reviewed and are negative.    Physical Exam  BP 144/90  Pulse 101  Temp(Src) 98.6 F (37 C) (Oral)  Resp 20  Ht 5\' 7"  (1.702 m)  Wt 160 lb (72.576 kg)  BMI 25.06 kg/m2  SpO2 100%  Physical Exam  Constitutional: She appears well-developed and well-nourished.  HENT:  Head: Normocephalic and atraumatic.  Right Ear: Tympanic membrane, external ear and ear canal normal.  Left Ear: Tympanic membrane, external ear and ear canal normal.  Nose:  Nose normal.  Mouth/Throat: Uvula is midline. Mucous membranes are dry.       Pt has diffusely inflammed, enlarged tonsils without soft palate swelling. Minimal cervical lymphadenopathy.   Neck: Normal range of motion and full passive range of motion without pain. Neck supple.       Pt has scar on right from prior cervical surgery  Cardiovascular: Regular rhythm, normal heart sounds and normal pulses.  Tachycardia present.   Pulmonary/Chest: Effort normal and breath sounds normal.  Musculoskeletal:       No gross abnormality, moves all extremities well, appears  diffusely weak  Neurological: She is alert. She has normal strength. No cranial nerve deficit.       PT has flat affect.   Skin: Skin is warm, dry and intact. No rash noted.  Psychiatric: Her speech is normal. Judgment and thought content normal. Her affect is blunt. She is slowed. Cognition and memory are normal.    Medications Advair BID, Metformin ER 500 mg QD, albuterol inhaler PRN  SH nonsmoker, no alcohol use, works in [employer], lives with sister  ED Course  Procedures  OTHER DATA REVIEWED: Nursing notes, vital signs, and past medical records reviewed.    DIAGNOSTIC STUDIES: Oxygen Saturation is 100% on room air, normal by my interpretation.     LABS / RADIOLOGY:  Results for orders placed during the hospital encounter of 07/06/11  RAPID STREP SCREEN      Component Value Range   Streptococcus, Group A Screen (Direct) NEGATIVE  NEGATIVE   BASIC METABOLIC PANEL      Component Value Range   Sodium 136  135 - 145 (mEq/L)   Potassium 3.2 (*) 3.5 - 5.1 (mEq/L)   Chloride 97  96 - 112 (mEq/L)   CO2 25  19 - 32 (mEq/L)   Glucose, Bld 126 (*) 70 - 99 (mg/dL)   BUN 19  6 - 23 (mg/dL)   Creatinine, Ser 1.91  0.50 - 1.10 (mg/dL)   Calcium 9.7  8.4 - 47.8 (mg/dL)   GFR calc non Af Amer >60  >60 (mL/min)   GFR calc Af Amer >60  >60 (mL/min)   Pt had hypokalemia which was treated with IV potassium x 10 meq.      ED COURSE / COORDINATION OF CARE: 09:45  -EDMD examined patient ordered a rapid strep screen to rule out strep throat. Orders Placed This Encounter  Procedures  . Rapid strep screen  . Saline lock IV     MDM:  Pt received IV fluids for her dehydration, IV pain and nausea meds, IV antibiotics (clindamycin, patient has PCN allergy) and one dose of IV decadron. Pt is now drinking fluids, looks like she is feeling better. Feels ready to go home. Have discussed reasons to return to the ED (unable to swallow).    Scribe  Attestation I personally performed the services described in this documentation, which was scribed in my presence. The recorded information has been reviewed and considered. Ward Givens, MD     Ward Givens, MD 07/06/11 1330

## 2011-07-06 NOTE — ED Notes (Signed)
Pt reports cough, fever, n/v, and diarrhea for the past 3 days.  Pt has been treating at home w/out relief.  Pt also reports sore throat and states "it feels like my throat is swollen".

## 2011-07-06 NOTE — ED Notes (Signed)
Gave pt a diet drink with ice

## 2012-05-26 ENCOUNTER — Other Ambulatory Visit (HOSPITAL_COMMUNITY)
Admission: RE | Admit: 2012-05-26 | Discharge: 2012-05-26 | Disposition: A | Discharge status: Home / Self Care | Payer: 59 | Source: Ambulatory Visit | Attending: Obstetrics and Gynecology | Admitting: Obstetrics and Gynecology

## 2012-05-26 DIAGNOSIS — Z01419 Encounter for gynecological examination (general) (routine) without abnormal findings: Secondary | ICD-10-CM | POA: Insufficient documentation

## 2012-05-27 ENCOUNTER — Other Ambulatory Visit: Payer: Self-pay | Admitting: Obstetrics and Gynecology

## 2013-06-09 ENCOUNTER — Other Ambulatory Visit: Payer: Self-pay | Admitting: Obstetrics & Gynecology

## 2013-06-13 ENCOUNTER — Other Ambulatory Visit (HOSPITAL_COMMUNITY)
Admission: RE | Admit: 2013-06-13 | Discharge: 2013-06-13 | Disposition: A | Discharge status: Home / Self Care | Payer: 59 | Source: Ambulatory Visit | Attending: Obstetrics & Gynecology | Admitting: Obstetrics & Gynecology

## 2013-06-13 ENCOUNTER — Ambulatory Visit (INDEPENDENT_AMBULATORY_CARE_PROVIDER_SITE_OTHER): Payer: 59 | Admitting: Obstetrics & Gynecology

## 2013-06-13 ENCOUNTER — Encounter: Payer: Self-pay | Admitting: Obstetrics & Gynecology

## 2013-06-13 DIAGNOSIS — Z01419 Encounter for gynecological examination (general) (routine) without abnormal findings: Secondary | ICD-10-CM | POA: Insufficient documentation

## 2013-06-13 DIAGNOSIS — Z1212 Encounter for screening for malignant neoplasm of rectum: Secondary | ICD-10-CM

## 2013-06-13 DIAGNOSIS — Z1151 Encounter for screening for human papillomavirus (HPV): Secondary | ICD-10-CM | POA: Insufficient documentation

## 2013-06-13 NOTE — Addendum Note (Signed)
Addended by: Colen Darling on: 06/13/2013 04:20 PM   Modules accepted: Orders

## 2013-06-13 NOTE — Progress Notes (Signed)
Patient ID: Christie Morgan, female   DOB: Jul 24, 1956, 57 y.o.   MRN: [id number] Subjective:     Christie Morgan is a 57 y.o. female here for a routine exam.  No LMP recorded. Patient has had a hysterectomy. supracervical  Current complaints: none.  Personal health questionnaire reviewed: yes.   Gynecologic History No LMP recorded. Patient has had a hysterectomy. Contraception: status post hysterectomy Last Pap: 2013. Results were: normal Last mammogram: 2014. Results were: normal  Obstetric History OB History   Grav Para Term Preterm Abortions TAB SAB Ect Mult Living                   The following portions of the patient's history were reviewed and updated as appropriate: allergies, current medications, past family history, past medical history, past social history, past surgical history and problem list.  Review of Systems  Review of Systems  Constitutional: Negative for fever, chills, weight loss, malaise/fatigue and diaphoresis.  HENT: Negative for hearing loss, ear pain, nosebleeds, congestion, sore throat, neck pain, tinnitus and ear discharge.   Eyes: Negative for blurred vision, double vision, photophobia, pain, discharge and redness.  Respiratory: Negative for cough, hemoptysis, sputum production, shortness of breath, wheezing and stridor.   Cardiovascular: Negative for chest pain, palpitations, orthopnea, claudication, leg swelling and PND.  Gastrointestinal: negative for abdominal pain. Negative for heartburn, nausea, vomiting, diarrhea, constipation, blood in stool and melena.  Genitourinary: Negative for dysuria, urgency, frequency, hematuria and flank pain.  Musculoskeletal: Negative for myalgias, back pain, joint pain and falls.  Skin: Negative for itching and rash.  Neurological: Negative for dizziness, tingling, tremors, sensory change, speech change, focal weakness, seizures, loss of consciousness, weakness and headaches.  Endo/Heme/Allergies: Negative for  environmental allergies and polydipsia. Does not bruise/bleed easily.  Psychiatric/Behavioral: Negative for depression, suicidal ideas, hallucinations, memory loss and substance abuse. The patient is not nervous/anxious and does not have insomnia.        Objective:    Physical Exam  Vitals reviewed. Constitutional: She is oriented to person, place, and time. She appears well-developed and well-nourished.  HENT:  Head: Normocephalic and atraumatic.        Right Ear: External ear normal.  Left Ear: External ear normal.  Nose: Nose normal.  Mouth/Throat: Oropharynx is clear and moist.  Eyes: Conjunctivae and EOM are normal. Pupils are equal, round, and reactive to light. Right eye exhibits no discharge. Left eye exhibits no discharge. No scleral icterus.  Neck: Normal range of motion. Neck supple. No tracheal deviation present. No thyromegaly present.  Cardiovascular: Normal rate, regular rhythm, normal heart sounds and intact distal pulses.  Exam reveals no gallop and no friction rub.   No murmur heard. Respiratory: Effort normal and breath sounds normal. No respiratory distress. She has no wheezes. She has no rales. She exhibits no tenderness.  Breasts no masses skin or nipple changes GI: Soft. Bowel sounds are normal. She exhibits no distension and no mass. There is no tenderness. There is no rebound and no guarding.  Genitourinary:       Vulva is normal without lesions Vagina is pink moist without discharge Cervix normal in appearance and pap is done Uterus is absent Adnexa is absent Rectal heme negative no masses normal tone Musculoskeletal: Normal range of motion. She exhibits no edema and no tenderness.  Neurological: She is alert and oriented to person, place, and time. She has normal reflexes. She displays normal reflexes. No cranial nerve deficit. She exhibits normal muscle  tone. Coordination normal.  Skin: Skin is warm and dry. No rash noted. No erythema. No pallor.   Psychiatric: She has a normal mood and affect. Her behavior is normal. Judgment and thought content normal.       Assessment:    Healthy female exam.    Plan:    Follow up in: 1 year.

## 2013-06-13 NOTE — Patient Instructions (Addendum)
Pelvic Exam A pelvic (gynecologic) exam is an exam of a woman's outer and inner genitals and reproductive organs. At age 57, or before a woman starts to have sexual intercourse, she should have her first pelvic exam. Pelvic exams allow your caregiver to check on normal development and screen for health problems. These exams should be done regularly throughout a woman's life. Usually, a general physical exam is done first. An exam of the breasts is also done. At this visit, you can ask questions about your health, body, menstrual cycles, sex, and birth control methods. Your caregiver will also ask you questions about your health, family health, menstrual periods, immunizations, and if you are sexually active. The information shared between you and your caregiver is kept confidential. REASONS FOR A PELVIC EXAM  Annual exam and Pap test. A Pap test removes cells from the cervix gently with a spatula and a small brush. The cells are tested for infection, precancer, and cancer.  A Pap test is done to screen for cervical cancer.  The first Pap test should be done at age 21.  Between ages 21 and 29, Pap tests are repeated every 2 years.  Beginning at age 30, you are advised to have a Pap test every 3 years as long as your past 3 Pap tests have been normal.  Some women have medical problems that increase the chance of getting cervical cancer. Talk to your caregiver about these problems. It is especially important to talk to your caregiver if a new problem develops soon after your last Pap test. In these cases, your caregiver may recommend more frequent screening and Pap tests.  The above recommendations are the same for women who have or have not gotten the vaccine for HPV (Human Papillomavirus).  If you had a hysterectomy for a problem that was not cancer or a condition that could lead to cancer, then you no longer need Pap tests. However, even if you no longer need a Pap test, a regular exam is a good  idea to make sure no other problems are starting.   If you are between ages 65 and 70, and you have had normal Pap tests going back 10 years, you no longer need Pap tests. However, even if you no longer need a Pap test, a regular exam is a good idea to make sure no other problems are starting.   If you have had past treatment for cervical cancer or a condition that could lead to cancer, you need Pap tests and screening for cancer for at least 20 years after your treatment.  If Pap tests have been discontinued, risk factors (such as a new sexual partner) need to be re-assessed to determine if screening should be resumed.  Some women may need screenings more often if they are at high risk for cervical cancer.  Make sure your female organs are normal and functioning correctly.  Evaluate a mass or other symptoms that suggest a reproductive system cancer.  Explore why you are not able to get pregnant (infertility).  Find a cause for vaginal discharge, itching, or burning.  Get certain types of birth control or start hormone therapy.  Look for causes of urinary incontinence or sexual problems.  Look for signs of sexually transmitted infection (STI).  Follow the progression of labor.  Determine if pregnancy is present or how far advanced the pregnancy is.  You have severe cramps during your menstrual period.  You have pain during sexual intercourse.  You have abnormal   menstrual periods.  You have no menstrual period by the age of 16. PROCEDURE   A pelvic exam is usually painless but may cause mild discomfort.  In unusual circumstances or in young girls, medicines may be used for comfort. A pelvic exam is not done routinely before a girl is sexually active. Special circumstances such as rape, trauma, or medical problems may require an exam.  You will remove all your clothes and will be given a gown. Usually, there is a nurse in the room during the exam and you can have someone  from your family with you also.  The general physical exam will be done first.  Before the pelvic exam starts, the woman lies down on her back on a special table. She puts the heels of her feet into foot rests (stirrups) with her legs apart. A gown, cloth, or paper drape is usually placed over her belly (abdomen) and legs. First, the caregiver checks the normal arrangement of body parts of the outer genitals. This includes the clitoris, vaginal opening, hymen, labia, and the perineal area between the vagina and rectum. The labia are the skin folds surrounding the vaginal opening. The tube that carries urine (urethra) is also examined.  An internal exam is done next. First, the caregiver inserts an instrument called a speculum into the vagina. The speculum has lubricant on it. The speculum helps hold the vaginal walls apart. The caregiver can then examine the vagina and cervix, which is the opening to the womb (uterus). Cultures of any discharge may be taken to check for an infection. A Pap test may be done.  After the internal exam is done, the speculum is removed. The caregiver uses latex gloves with a lubricant on the fingers to gently press against various pelvic organs from inside the vagina while the other hand is on the lower belly. The caregiver will note any tenderness or abnormalities.  If a pelvic exam is done on a woman who is thought to be in labor, her caregiver can check on the baby and how far her cervix has opened.  Following the exam, you will get dressed and can speak with your caregiver.  Ask your caregiver when and how often you should return for future visits. Finding out the results of your test Ask when your test results will be ready. Make sure you get your test results. TO HAVE A HEALTHY LIFESTYLE:  Follow your caregiver's advice regarding follow-up and future visits.  Get the necessary immunizations according to your age and any traveling you may do.  Eat a balanced,  nourishing diet.  Get plenty of rest and sleep.  Exercise regularly.  Maintain a healthy weight.  Do not smoke or take illegal drugs.  Drink alcohol in moderation or not at all.  If you are sexually active, use some form of birth control if you do not plan to get pregnant.  If you are sexually active, practice safe sex by using a condom to protect against sexually transmitted disease (STD).  Get help or counseling if you have emotional problems. Document Released: 01/17/2003 Document Revised: 01/19/2012 Document Reviewed: 01/23/2010 [employer] Patient Information 2014 [employer].  

## 2014-04-27 ENCOUNTER — Ambulatory Visit (INDEPENDENT_AMBULATORY_CARE_PROVIDER_SITE_OTHER): Payer: 59 | Admitting: Otolaryngology

## 2014-04-27 DIAGNOSIS — H6120 Impacted cerumen, unspecified ear: Secondary | ICD-10-CM

## 2014-04-27 DIAGNOSIS — H902 Conductive hearing loss, unspecified: Secondary | ICD-10-CM

## 2014-06-27 ENCOUNTER — Other Ambulatory Visit: Payer: 59 | Admitting: Adult Health

## 2014-08-02 ENCOUNTER — Other Ambulatory Visit: Payer: 59 | Admitting: Adult Health

## 2014-08-08 ENCOUNTER — Ambulatory Visit (INDEPENDENT_AMBULATORY_CARE_PROVIDER_SITE_OTHER): Payer: BC Managed Care – PPO | Admitting: Adult Health

## 2014-08-08 ENCOUNTER — Encounter: Payer: Self-pay | Admitting: Adult Health

## 2014-08-08 VITALS — BP 140/80 | Ht 67.0 in | Wt 161.5 lb

## 2014-08-08 DIAGNOSIS — Z1212 Encounter for screening for malignant neoplasm of rectum: Secondary | ICD-10-CM

## 2014-08-08 DIAGNOSIS — Z01419 Encounter for gynecological examination (general) (routine) without abnormal findings: Secondary | ICD-10-CM

## 2014-08-08 LAB — HEMOCCULT GUIAC POC 1CARD (OFFICE): FECAL OCCULT BLD: NEGATIVE

## 2014-08-08 NOTE — Progress Notes (Signed)
Patient ID: Christie Morgan, female   DOB: [birth year], 58 y.o.   MRN: [id number] History of Present Illness: Christie Morgan is a 58 year old black female in for a gyn physical.She had a normal pap with negative HPV.No complaints.  Current Medications, Allergies, Past Medical History, Past Surgical History, Family History and Social History were reviewed in [employer] record.     Review of Systems: Patient denies any headaches, blurred vision, shortness of breath, chest pain, abdominal pain, problems with bowel movements, urination, or intercourse. No joint pain or mood swings    Physical Exam:BP 140/80  Ht 5\' 7"  (1.702 m)  Wt 161 lb 8 oz (73.256 kg)  BMI 25.29 kg/m2 General:  Well developed, well nourished, no acute distress Skin:  Warm and dry Neck:  Midline trachea, normal thyroid Lungs; Clear to auscultation bilaterally Breast:  No dominant palpable mass, retraction, or nipple discharge Cardiovascular: Regular rate and rhythm Abdomen:  Soft, non tender, no hepatosplenomegaly Pelvic:  External genitalia is normal in appearance, no lesions.  The vagina is normal in appearance.       The cervix is bulbous.  Uterus is absent.  No adnexal masses or tenderness noted. Rectal: Good sphincter tone, no polyps, or hemorrhoids felt.  Hemoccult negative.mild rectocele Extremities:  No swelling or varicosities noted Psych:  No mood changes,alert and cooperative,seems happy   Impression: Yearly gyn exam no pap    Plan: Physical in 1 year Pap in 2017 Mammogram yearly Labs and flu shot at work

## 2014-08-08 NOTE — Patient Instructions (Signed)
Physical in 1 year Pap in 2017 Mammogram yearly Get flu shot and labs at work

## 2014-09-11 ENCOUNTER — Encounter: Payer: Self-pay | Admitting: Adult Health

## 2014-10-04 ENCOUNTER — Other Ambulatory Visit: Payer: Self-pay | Admitting: Obstetrics and Gynecology

## 2014-10-04 DIAGNOSIS — Z1231 Encounter for screening mammogram for malignant neoplasm of breast: Secondary | ICD-10-CM

## 2014-10-12 ENCOUNTER — Ambulatory Visit (HOSPITAL_COMMUNITY)
Admission: RE | Admit: 2014-10-12 | Discharge: 2014-10-12 | Disposition: A | Discharge status: Home / Self Care | Payer: BC Managed Care – PPO | Source: Ambulatory Visit | Attending: Obstetrics and Gynecology | Admitting: Obstetrics and Gynecology

## 2014-10-12 DIAGNOSIS — Z1231 Encounter for screening mammogram for malignant neoplasm of breast: Secondary | ICD-10-CM | POA: Insufficient documentation

## 2015-05-15 ENCOUNTER — Encounter: Payer: Self-pay | Admitting: Obstetrics and Gynecology

## 2015-08-09 ENCOUNTER — Other Ambulatory Visit: Payer: Self-pay | Admitting: Obstetrics and Gynecology

## 2015-08-10 ENCOUNTER — Encounter: Payer: Self-pay | Admitting: Adult Health

## 2015-08-10 ENCOUNTER — Ambulatory Visit (INDEPENDENT_AMBULATORY_CARE_PROVIDER_SITE_OTHER): Payer: BLUE CROSS/BLUE SHIELD | Admitting: Adult Health

## 2015-08-10 VITALS — BP 150/80 | HR 76 | Ht 68.0 in | Wt 158.0 lb

## 2015-08-10 DIAGNOSIS — Z1211 Encounter for screening for malignant neoplasm of colon: Secondary | ICD-10-CM | POA: Diagnosis not present

## 2015-08-10 DIAGNOSIS — Z01419 Encounter for gynecological examination (general) (routine) without abnormal findings: Secondary | ICD-10-CM

## 2015-08-10 DIAGNOSIS — Z113 Encounter for screening for infections with a predominantly sexual mode of transmission: Secondary | ICD-10-CM

## 2015-08-10 LAB — HEMOCCULT GUIAC POC 1CARD (OFFICE): FECAL OCCULT BLD: NEGATIVE

## 2015-08-10 NOTE — Patient Instructions (Signed)
Pap and physical in 1 year Mammogram yearly Labs and flu shot at work in October Colonoscopy pr GI

## 2015-08-10 NOTE — Progress Notes (Signed)
Patient ID: Christie Morgan, female   DOB: 10-10-1956, 59 y.o.   MRN: [id number] History of Present Illness: Christie Morgan is a 59 year old black female, in for well woman gyn exam, she had a normal pap with negative HPV 06/13/13, she is sp supracervical hysterectomy.She requests STD screening. PCP is Dr Legrand Rams.  Current Medications, Allergies, Past Medical History, Past Surgical History, Family History and Social History were reviewed in [employer] record.     Review of Systems: Patient denies any headaches, hearing loss, fatigue, blurred vision, shortness of breath, chest pain, abdominal pain, problems with bowel movements, urination, or intercourse. No joint pain or mood swings.    Physical Exam:BP 150/80 mmHg  Pulse 76  Ht 5\' 8"  (1.727 m)  Wt 158 lb (71.668 kg)  BMI 24.03 kg/m2 General:  Well developed, well nourished, no acute distress Skin:  Warm and dry Neck:  Midline trachea, normal thyroid, good ROM, no lymphadenopathy Lungs; Clear to auscultation bilaterally Breast:  No dominant palpable mass, retraction, or nipple discharge Cardiovascular: Regular rate and rhythm Abdomen:  Soft, non tender, no hepatosplenomegaly Pelvic:  External genitalia is normal in appearance, no lesions.  The vagina is normal in appearance. Urethra has no lesions or masses. The cervix is bulbous.GC/CHL obtained.  Uterus is absent. No adnexal masses or tenderness noted.Bladder is non tender, no masses felt. Rectal: Good sphincter tone, no polyps, or hemorrhoids felt.  Hemoccult negative. Extremities/musculoskeletal:  No swelling or varicosities noted, no clubbing or cyanosis Psych:  No mood changes, alert and cooperative,seems happy Monitor BP and keep Dr Legrand Rams aware.  Impression: Well woman gyn exam no pap STD screening    Plan: Pap and physical in 1 year Mammogram yearly Labs and flu shot at work in October Colonoscopy per GI

## 2015-08-13 ENCOUNTER — Other Ambulatory Visit: Payer: Self-pay | Admitting: Adult Health

## 2015-08-14 LAB — GC/CHLAMYDIA PROBE AMP
Chlamydia trachomatis, NAA: NEGATIVE
Neisseria gonorrhoeae by PCR: NEGATIVE

## 2015-08-23 ENCOUNTER — Telehealth: Payer: Self-pay | Admitting: Adult Health

## 2015-08-23 NOTE — Telephone Encounter (Signed)
Spoke with pt letting her know her GC/CHL was negative. Pt voiced understanding. [place]

## 2016-08-13 ENCOUNTER — Ambulatory Visit (INDEPENDENT_AMBULATORY_CARE_PROVIDER_SITE_OTHER): Payer: BLUE CROSS/BLUE SHIELD | Admitting: Adult Health

## 2016-08-13 ENCOUNTER — Encounter: Payer: Self-pay | Admitting: Adult Health

## 2016-08-13 ENCOUNTER — Other Ambulatory Visit (HOSPITAL_COMMUNITY)
Admission: RE | Admit: 2016-08-13 | Discharge: 2016-08-13 | Disposition: A | Discharge status: Home / Self Care | Payer: BLUE CROSS/BLUE SHIELD | Source: Ambulatory Visit | Attending: Adult Health | Admitting: Adult Health

## 2016-08-13 VITALS — BP 150/90 | HR 70 | Ht 67.5 in | Wt 165.0 lb

## 2016-08-13 DIAGNOSIS — Z113 Encounter for screening for infections with a predominantly sexual mode of transmission: Secondary | ICD-10-CM | POA: Insufficient documentation

## 2016-08-13 DIAGNOSIS — Z1151 Encounter for screening for human papillomavirus (HPV): Secondary | ICD-10-CM | POA: Insufficient documentation

## 2016-08-13 DIAGNOSIS — N816 Rectocele: Secondary | ICD-10-CM

## 2016-08-13 DIAGNOSIS — Z01419 Encounter for gynecological examination (general) (routine) without abnormal findings: Secondary | ICD-10-CM | POA: Diagnosis not present

## 2016-08-13 DIAGNOSIS — Z1212 Encounter for screening for malignant neoplasm of rectum: Secondary | ICD-10-CM

## 2016-08-13 DIAGNOSIS — N8110 Cystocele, unspecified: Secondary | ICD-10-CM

## 2016-08-13 LAB — HEMOCCULT GUIAC POC 1CARD (OFFICE): Fecal Occult Blood, POC: NEGATIVE

## 2016-08-13 NOTE — Patient Instructions (Signed)
Physical in 1 year, pap in 3 if normal Mammogram yearly Colonoscopy per GI Labs and flu shot at work

## 2016-08-13 NOTE — Progress Notes (Signed)
Patient ID: Christie Morgan, female   DOB: 24-Dec-1955, 60 y.o.   MRN: [id number] History of Present Illness: Christie Morgan is a 60 year old black female in for well woman gyn exam and pap, she is sp [facility].She says she pees often at night about 3 times, and has constipation at times.  PCP is Dr Legrand Rams.   Current Medications, Allergies, Past Medical History, Past Surgical History, Family History and Social History were reviewed in [employer] record.     Review of Systems: Patient denies any headaches, hearing loss, fatigue, blurred vision, shortness of breath, chest pain, abdominal pain, problems with intercourse. No joint pain or mood swings. Has to pee like 3 times at night.Constipation at times.   Physical Exam:BP (!) 150/90 (BP Location: Left Arm, Patient Position: Sitting, Cuff Size: Normal)   Pulse 70   Ht 5' 7.5" (1.715 m)   Wt 165 lb (74.8 kg)   BMI 25.46 kg/m  General:  Well developed, well nourished, no acute distress Skin:  Warm and dry Neck:  Midline trachea, normal thyroid, good ROM, no lymphadenopathy, no carotid bruits heard Lungs; Clear to auscultation bilaterally Breast:  No dominant palpable mass, retraction, or nipple discharge Cardiovascular: Regular rate and rhythm Abdomen:  Soft, non tender, no hepatosplenomegaly Pelvic:  External genitalia is normal in appearance, no lesions.  The vagina is normal in appearance. Urethra has no lesions or masses. +cystocele.The cervix is bulbous.  Uterus is absent.  No adnexal masses or tenderness noted.Bladder is non tender, no masses felt. Rectal: Good sphincter tone, no polyps, or hemorrhoids felt.  Hemoccult negative.+ rectocele Extremities/musculoskeletal:  No swelling or varicosities noted, no clubbing or cyanosis Psych:  No mood changes, alert and cooperative,seems happy PHQ 2 score is 0. Discussed cystocele and rectocele and used medical explainer #4, she declines any meds at this time for nocturia.    Impression: 1. Well woman exam with routine gynecological exam   2. Cystocele with rectocele       Plan: Physical in 1 year, pap in 3 if normal Mammogram yearly Colonoscopy per GI Labs and flu shot at work

## 2016-08-15 LAB — CYTOLOGY - PAP

## 2016-10-15 ENCOUNTER — Telehealth: Payer: Self-pay | Admitting: Adult Health

## 2016-10-15 NOTE — Telephone Encounter (Signed)
Phone had busy signal,ws going to let her know I reviewed her labs, F/U with Dr Legrand Rams

## 2017-08-26 ENCOUNTER — Encounter (INDEPENDENT_AMBULATORY_CARE_PROVIDER_SITE_OTHER): Payer: Self-pay

## 2017-08-26 ENCOUNTER — Ambulatory Visit (INDEPENDENT_AMBULATORY_CARE_PROVIDER_SITE_OTHER): Payer: BLUE CROSS/BLUE SHIELD | Admitting: Adult Health

## 2017-08-26 ENCOUNTER — Encounter: Payer: Self-pay | Admitting: Adult Health

## 2017-08-26 VITALS — BP 142/80 | HR 79 | Ht 68.25 in | Wt 166.0 lb

## 2017-08-26 DIAGNOSIS — N816 Rectocele: Secondary | ICD-10-CM | POA: Diagnosis not present

## 2017-08-26 DIAGNOSIS — Z1211 Encounter for screening for malignant neoplasm of colon: Secondary | ICD-10-CM

## 2017-08-26 DIAGNOSIS — Z01419 Encounter for gynecological examination (general) (routine) without abnormal findings: Secondary | ICD-10-CM | POA: Insufficient documentation

## 2017-08-26 DIAGNOSIS — Z01411 Encounter for gynecological examination (general) (routine) with abnormal findings: Secondary | ICD-10-CM

## 2017-08-26 DIAGNOSIS — Z1212 Encounter for screening for malignant neoplasm of rectum: Secondary | ICD-10-CM | POA: Diagnosis not present

## 2017-08-26 DIAGNOSIS — N8110 Cystocele, unspecified: Secondary | ICD-10-CM

## 2017-08-26 LAB — HEMOCCULT GUIAC POC 1CARD (OFFICE): Fecal Occult Blood, POC: NEGATIVE

## 2017-08-26 NOTE — Progress Notes (Signed)
Patient ID: Christie Morgan, female   DOB: [birth year], 61 y.o.   MRN: [id number] History of Present Illness: Christie Morgan is a 61 year old black female in for a well woman gyn exam, she had a normal pap with negative HPV 08/13/16.She is still working at [employer].She had labs and flu shot at work last week, and had 3D mammogram at work on mobile unit.  PCP is Dr Legrand Rams.   Current Medications, Allergies, Past Medical History, Past Surgical History, Family History and Social History were reviewed in [employer] record.     Review of Systems:  Patient denies any headaches, hearing loss, fatigue, blurred vision, shortness of breath, chest pain, abdominal pain, problems with bowel movements, urination, or intercourse. No joint pain or mood swings.   Physical Exam:BP (!) 142/80 (BP Location: Left Arm, Patient Position: Sitting, Cuff Size: Normal)   Pulse 79   Ht 5' 8.25" (1.734 m)   Wt 166 lb (75.3 kg)   BMI 25.06 kg/m  General:  Well developed, well nourished, no acute distress Skin:  Warm and dry Neck:  Midline trachea, normal thyroid, good ROM, no lymphadenopathy Lungs; Clear to auscultation bilaterally Breast:  No dominant palpable mass, retraction, or nipple discharge Cardiovascular: Regular rate and rhythm Abdomen:  Soft, non tender, no hepatosplenomegaly Pelvic:  External genitalia is normal in appearance, no lesions.  The vagina is normal in appearance. Urethra has no lesions or masses. The cervix is bulbous.  Uterus is absent,+cystocele. No adnexal masses or tenderness noted.Bladder is non tender, no masses felt. Rectal: Good sphincter tone, no polyps, or hemorrhoids felt.  Hemoccult negative.+rectocele. Extremities/musculoskeletal:  No swelling or varicosities noted, no clubbing or cyanosis Psych:  No mood changes, alert and cooperative,seems happy PHQ 2 score 0.  Impression:  1. Encounter for well woman exam with routine gynecological exam   2. Cystocele  with rectocele   3. Screening for colorectal cancer      Plan: Physical in 1 year Mammogram yearly Pap in 2020 Colonoscopy per GI

## 2017-08-26 NOTE — Patient Instructions (Addendum)
Physical in 1 year Mammogram yearly Pap in 2020 Colonoscopy per GI

## 2020-02-21 ENCOUNTER — Encounter: Payer: Self-pay | Admitting: Gastroenterology
